# Patient Record
Sex: Male | Born: 1990 | Race: Black or African American | Hispanic: No | Marital: Single | Smoking: Current every day smoker
Health system: Southern US, Community
[De-identification: ages and names within clinical notes are randomized; demographics above are authoritative.]

## PROBLEM LIST (undated history)

## (undated) ENCOUNTER — Emergency Department: Discharge status: Absent without leave | Payer: No Typology Code available for payment source

## (undated) DIAGNOSIS — R569 Unspecified convulsions: Secondary | ICD-10-CM

---
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
## 2014-06-14 ENCOUNTER — Emergency Department
Admit: 2014-06-14 | Disposition: A | Discharge status: Home / Self Care | Payer: Self-pay | Admitting: Emergency Medicine

## 2015-11-15 ENCOUNTER — Encounter: Payer: Self-pay | Admitting: Emergency Medicine

## 2015-11-15 ENCOUNTER — Emergency Department
Admission: EM | Admit: 2015-11-15 | Discharge: 2015-11-15 | Disposition: A | Discharge status: Home / Self Care | Payer: Self-pay | Attending: Emergency Medicine | Admitting: Emergency Medicine

## 2015-11-15 DIAGNOSIS — L255 Unspecified contact dermatitis due to plants, except food: Secondary | ICD-10-CM | POA: Insufficient documentation

## 2015-11-15 DIAGNOSIS — Z79899 Other long term (current) drug therapy: Secondary | ICD-10-CM | POA: Insufficient documentation

## 2015-11-15 DIAGNOSIS — L259 Unspecified contact dermatitis, unspecified cause: Secondary | ICD-10-CM

## 2015-11-15 HISTORY — DX: Unspecified convulsions: R56.9

## 2015-11-15 MED ORDER — DEXAMETHASONE SODIUM PHOSPHATE 10 MG/ML IJ SOLN
10.0000 mg | Freq: Once | INTRAMUSCULAR | Status: AC
  Administered 2015-11-15: 10 mg via INTRAMUSCULAR
  Filled 2015-11-15: qty 1

## 2015-11-15 MED ORDER — PREDNISONE 10 MG PO TABS: ORAL_TABLET | ORAL | 0 refills | Status: AC

## 2015-11-15 MED ORDER — DEXAMETHASONE SODIUM PHOSPHATE 10 MG/ML IJ SOLN
INTRAMUSCULAR | Status: AC
Start: 2015-11-15 — End: 2015-11-15
  Administered 2015-11-15: 10 mg via INTRAMUSCULAR
  Filled 2015-11-15: qty 1

## 2015-11-15 NOTE — ED Triage Notes (Signed)
Pt states "I got into some poison ivy", reports using OTC creams with no relief. Pt reports rash to body x1 week, airway intact, no respiratory distress noted.

## 2015-11-15 NOTE — ED Provider Notes (Signed)
[facility] Emergency Department Provider Note  ____________________________________________   First MD Initiated Contact with Patient 11/15/15 1322     (approximate)  I have reviewed the triage vital signs and the nursing notes.   HISTORY  Chief Complaint Rash   HPI Sergio Lindsey is a 25 y.o. male here complaining of rash for approximate 4-5 days. 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 Patient states he has been itching has been using over-the-counter medication without any relief. He has been also applying topical cream without any relief of his rash or itching. He denies any previous problems with dermatitis.   Past Medical History:  Diagnosis Date  . Seizures (HCC)     There are no active problems to display for this patient.   No past surgical history on file.  Prior to Admission medications   Medication Sig Start Date End Date Taking? Authorizing Provider  divalproex (DEPAKOTE ER) 500 MG 24 hr tablet Take 750 mg by mouth daily.   Yes Historical Provider, MD  predniSONE (DELTASONE) 10 MG tablet Take 6 tablets  today, on day 2 take 5 tablets, day 3 take 4 tablets, day 4 take 3 tablets, day 5 take  2 tablets and 1 tablet the last day 11/15/15   Tommi Rumps, PA-C    Allergies Review of patient's allergies indicates no known allergies.  No family history on file.  Social History Social History  Substance Use Topics  . Smoking status: Never Smoker  . Smokeless tobacco: Never Used  . Alcohol use No    Review of Systems Constitutional: No fever/chills Eyes: No visual changes. Cardiovascular: Denies chest pain. Respiratory: Denies shortness of breath. Gastrointestinal: No nausea, no vomiting.  Musculoskeletal: Negative for back pain. Skin: Positive for rash. Neurological: Negative for headaches, focal weakness or numbness.  10-point ROS otherwise  negative.  ____________________________________________   PHYSICAL EXAM:  VITAL SIGNS: ED Triage Vitals  Enc Vitals Group     BP 11/15/15 1325 125/67     Pulse Rate 11/15/15 1325 89     Resp 11/15/15 1325 16     Temp 11/15/15 1325 98.4 F (36.9 C)     Temp Source 11/15/15 1325 Oral     SpO2 11/15/15 1325 99 %     Weight 11/15/15 1326 148 lb (67.1 kg)     Height 11/15/15 1326 5\' 7"  (1.702 m)     Head Circumference --      Peak Flow --      Pain Score --      Pain Loc --      Pain Edu? --      Excl. in GC? --     Constitutional: Alert and oriented. Well appearing and in no acute distress. Eyes: Conjunctivae are normal. PERRL. EOMI. Head: Atraumatic. Nose: No congestion/rhinnorhea. Mouth/Throat: Mucous membranes are moist.  Oropharynx non-erythematous.  No edema noted. Neck: No stridor.   Cardiovascular: Normal rate, regular rhythm. Grossly normal heart sounds.  Good peripheral circulation. Respiratory: Normal respiratory effort.  No retractions. Lungs CTAB. Musculoskeletal: No lower extremity tenderness nor edema.  No joint effusions. Neurologic:  Normal speech and language. No gross focal neurologic deficits are appreciated.  Skin:  Skin is warm, dry and intact.  There is a papular rash noted on the face, arms, hands and lower extremities. There are no vesicles noted. Patient is noted currently to be scratching at his legs. Psychiatric: Mood and  affect are normal. Speech and behavior are normal.  ____________________________________________   LABS (all labs ordered are listed, but only abnormal results are displayed)  Labs Reviewed - No data to display  PROCEDURES  Procedure(s) performed: None  Procedures  Critical Care performed: No  ____________________________________________   INITIAL IMPRESSION / ASSESSMENT AND PLAN / ED COURSE  Pertinent labs & imaging results that were available during my care of the patient were reviewed by me and considered in my  medical decision making (see chart for details).    Clinical Course   Patient is continue taking Benadryl over-the-counter as needed for itching. He was given Decadron 10 mg IM while in the emergency room and a prescription for prednisone 60 mg, 6 day taper. Patient is instructed to follow-up with [facility] or his primary care doctor if any continued problems.  ____________________________________________   FINAL CLINICAL IMPRESSION(S) / ED DIAGNOSES  Final diagnoses:  Contact dermatitis      NEW MEDICATIONS STARTED DURING THIS VISIT:  Discharge Medication List as of 11/15/2015  1:49 PM    START taking these medications   Details  predniSONE (DELTASONE) 10 MG tablet Take 6 tablets  today, on day 2 take 5 tablets, day 3 take 4 tablets, day 4 take 3 tablets, day 5 take  2 tablets and 1 tablet the last day, Print         Note:  This document was prepared using Dragon voice recognition software and may include unintentional dictation errors.    Tommi RumpsRhonda L Misti Towle, PA-C 11/15/15 1727    Myrna Blazeravid Matthew Schaevitz, MD 11/16/15 [phone]

## 2015-11-15 NOTE — Discharge Instructions (Signed)
Begin taking prednisone as directed. Follow-up with your primary care doctor in [place] or see [facility] acute-care if any continued problems.

## 2016-04-13 ENCOUNTER — Emergency Department
Admission: EM | Admit: 2016-04-13 | Discharge: 2016-04-13 | Disposition: A | Discharge status: Home / Self Care | Payer: Self-pay | Attending: Emergency Medicine | Admitting: Emergency Medicine

## 2016-04-13 DIAGNOSIS — Z202 Contact with and (suspected) exposure to infections with a predominantly sexual mode of transmission: Secondary | ICD-10-CM | POA: Insufficient documentation

## 2016-04-13 DIAGNOSIS — N341 Nonspecific urethritis: Secondary | ICD-10-CM | POA: Insufficient documentation

## 2016-04-13 DIAGNOSIS — Z711 Person with feared health complaint in whom no diagnosis is made: Secondary | ICD-10-CM

## 2016-04-13 LAB — URINALYSIS, COMPLETE (UACMP) WITH MICROSCOPIC
BACTERIA UA: NONE SEEN
BILIRUBIN URINE: NEGATIVE
Glucose, UA: NEGATIVE mg/dL
HGB URINE DIPSTICK: NEGATIVE
KETONES UR: 5 mg/dL — AB
NITRITE: NEGATIVE
PH: 7 (ref 5.0–8.0)
Protein, ur: 30 mg/dL — AB
SPECIFIC GRAVITY, URINE: 1.025 (ref 1.005–1.030)
Squamous Epithelial / LPF: NONE SEEN

## 2016-04-13 LAB — CHLAMYDIA/NGC RT PCR (ARMC ONLY)
CHLAMYDIA TR: NOT DETECTED
N gonorrhoeae: DETECTED — AB

## 2016-04-13 MED ORDER — CEFTRIAXONE SODIUM 1 G IJ SOLR
500.0000 mg | Freq: Once | INTRAMUSCULAR | Status: AC
  Administered 2016-04-13: 500 mg via INTRAMUSCULAR
  Filled 2016-04-13: qty 10

## 2016-04-13 MED ORDER — AZITHROMYCIN 500 MG PO TABS
1000.0000 mg | ORAL_TABLET | Freq: Once | ORAL | Status: AC
  Administered 2016-04-13: 1000 mg via ORAL
  Filled 2016-04-13: qty 2

## 2016-04-13 NOTE — ED Notes (Signed)
Pt presents to ED with c/o burning with urination and yellow discharge. Pt states that he masturbated last week with bath and body works lotion "but didn't know it had the chemicals in it". Pt states that he thinks it's the bath and body works lotion because he "hasn't been around nothing like that" without using a condom.

## 2016-04-13 NOTE — ED Triage Notes (Signed)
Pt c/o penile irritation, painful urination for the past week and wants it checked "so I can have sex"

## 2016-04-13 NOTE — ED Notes (Signed)
NAD noted at time of D/C. Pt denies questions or concerns. Pt ambulatory to the lobby at this time.  

## 2016-04-13 NOTE — Discharge Instructions (Signed)
Follow-up with [facility]. Call phone number listed on the paperwork given to you. [phone]. Your Chlamydia and gonorrhea testing will not be resulted until much later this evening. However today he had been treated for both sexually transmitted diseases. Sexual partners will need to be treated at the health department. Information about safe sex is also provided for review.

## 2016-04-13 NOTE — ED Notes (Signed)
While doing VS pt stated "I was masterbating while using bath and body works lotion for 20 min to make them smell good."

## 2016-04-13 NOTE — ED Provider Notes (Signed)
[facility] Emergency Department Provider Note  ____________________________________________   First MD Initiated Contact with Patient 04/13/16 1455     (approximate)  I have reviewed the triage vital signs and the nursing notes.   HISTORY  Chief Complaint Penile Discharge   HPI Sergio Lindsey is a 26 y.o. male is here complaining of penile discharge and started yesterday. Patient gives a history of masturbating while using some  Bath and body lotion. Patient states that he did not know that this was considered a chemical and believes that this is causing this problem. He is here to be "checked out" because he wants to have sex. He denies any previous urethral discharges in the past. He denies any fever, chills, nausea or vomiting. He states the discharge is yellow but cannot describe it any further. He has been using napkins inside his underwear.   Past Medical History:  Diagnosis Date  . Seizures (HCC)     There are no active problems to display for this patient.   History reviewed. No pertinent surgical history.  Prior to Admission medications   Medication Sig Start Date End Date Taking? Authorizing Provider  divalproex (DEPAKOTE ER) 500 MG 24 hr tablet Take 750 mg by mouth daily.    Historical Provider, MD  predniSONE (DELTASONE) 10 MG tablet Take 6 tablets  today, on day 2 take 5 tablets, day 3 take 4 tablets, day 4 take 3 tablets, day 5 take  2 tablets and 1 tablet the last day 11/15/15   Tommi Rumpshonda L Talullah Abate, PA-C    Allergies Patient has no known allergies.  No family history on file.  Social History Social History  Substance Use Topics  . Smoking status: Never Smoker  . Smokeless tobacco: Never Used  . Alcohol use No    Review of Systems Constitutional: No fever/chills Cardiovascular: Denies chest pain. Respiratory: Denies shortness of breath. Gastrointestinal:  No nausea, no vomiting.  Genitourinary: Positive for  dysuria. Musculoskeletal: Negative for back pain. Skin: Negative for rash. Neurological: Negative for headaches, focal weakness or numbness.  10-point ROS otherwise negative.  ____________________________________________   PHYSICAL EXAM:  VITAL SIGNS: ED Triage Vitals  Enc Vitals Group     BP 04/13/16 1426 136/83     Pulse Rate 04/13/16 1426 70     Resp 04/13/16 1426 20     Temp 04/13/16 1426 98 F (36.7 C)     Temp Source 04/13/16 1426 Oral     SpO2 04/13/16 1426 99 %     Weight 04/13/16 1427 165 lb (74.8 kg)     Height 04/13/16 1427 5\' 7"  (1.702 m)     Head Circumference --      Peak Flow --      Pain Score --      Pain Loc --      Pain Edu? --      Excl. in GC? --     Constitutional: Alert and oriented. Well appearing and in no acute distress. Eyes: Conjunctivae are normal. PERRL. EOMI. Head: Atraumatic. Nose: No congestion/rhinnorhea. Neck: No stridor.   Cardiovascular: Normal rate, regular rhythm. Grossly normal heart sounds.  Good peripheral circulation. Respiratory: Normal respiratory effort.  No retractions. Lungs CTAB. Gastrointestinal: Soft and nontender. No distention.  Genitourinary: Copious purulent discharge noted from the meatus. No vesicles were noted. Musculoskeletal: No lower extremity tenderness nor edema.  No joint effusions. Neurologic:  Normal speech and language. No gross focal neurologic deficits are appreciated. No gait instability.  Skin:  Skin is warm, dry and intact. No rash noted. Psychiatric: Mood and affect are normal. Speech and behavior are normal.  ____________________________________________   LABS (all labs ordered are listed, but only abnormal results are displayed)  Labs Reviewed  CHLAMYDIA/NGC RT PCR ([facility] ONLY) - Abnormal; Notable for the following:       Result Value   N gonorrhoeae DETECTED (*)    All other components within normal limits  URINALYSIS, COMPLETE (UACMP) WITH MICROSCOPIC - Abnormal; Notable for  the following:    Color, Urine YELLOW (*)    APPearance CLEAR (*)    Ketones, ur 5 (*)    Protein, ur 30 (*)    Leukocytes, UA SMALL (*)    All other components within normal limits    PROCEDURES  Procedure(s) performed: None  Procedures  Critical Care performed: No  ____________________________________________   INITIAL IMPRESSION / ASSESSMENT AND PLAN / ED COURSE  Pertinent labs & imaging results that were available during my care of the patient were reviewed by me and considered in my medical decision making (see chart for details).  Patient finally admitted that he had oral sex with someone different than his girlfriend. He was encouraged to have physical was treated and given information about the STD clinic at the health Department. Patient states that he was unaware that he could get any diseases from oral sex. Patient was given Rocephin 500 mg IM and Zithromax thousand milligrams by mouth while in the emergency room.      ____________________________________________   FINAL CLINICAL IMPRESSION(S) / ED DIAGNOSES  Final diagnoses:  Nonspecific urethritis  Concern about STD in male without diagnosis      NEW MEDICATIONS STARTED DURING THIS VISIT:  Discharge Medication List as of 04/13/2016  3:58 PM       Note:  This document was prepared using Dragon voice recognition software and may include unintentional dictation errors.    Tommi Rumps, PA-C 04/15/16 1610    Jennye Moccasin, MD 04/15/16 [phone]

## 2016-04-14 ENCOUNTER — Telehealth: Payer: Self-pay | Admitting: Emergency Medicine

## 2016-04-14 NOTE — Telephone Encounter (Signed)
Contacted patient and informed him of positive gonorrhea test.  He says partner is going in for treatment today.

## 2017-08-13 ENCOUNTER — Emergency Department
Admission: EM | Admit: 2017-08-13 | Discharge: 2017-08-13 | Disposition: A | Discharge status: Home / Self Care | Payer: Self-pay | Attending: Emergency Medicine | Admitting: Emergency Medicine

## 2017-08-13 ENCOUNTER — Encounter: Payer: Self-pay | Admitting: Medical Oncology

## 2017-08-13 DIAGNOSIS — R569 Unspecified convulsions: Secondary | ICD-10-CM | POA: Insufficient documentation

## 2017-08-13 LAB — COMPREHENSIVE METABOLIC PANEL
ALT: 13 U/L — AB (ref 17–63)
AST: 37 U/L (ref 15–41)
Albumin: 4.3 g/dL (ref 3.5–5.0)
Alkaline Phosphatase: 62 U/L (ref 38–126)
Anion gap: 17 — ABNORMAL HIGH (ref 5–15)
BILIRUBIN TOTAL: 0.3 mg/dL (ref 0.3–1.2)
BUN: 14 mg/dL (ref 6–20)
CO2: 19 mmol/L — ABNORMAL LOW (ref 22–32)
CREATININE: 0.89 mg/dL (ref 0.61–1.24)
Calcium: 9.6 mg/dL (ref 8.9–10.3)
Chloride: 104 mmol/L (ref 101–111)
GFR calc Af Amer: >60 mL/min (ref >60)
Glucose, Bld: 182 mg/dL — ABNORMAL HIGH (ref 65–99)
POTASSIUM: 3.7 mmol/L (ref 3.5–5.1)
Sodium: 140 mmol/L (ref 135–145)
TOTAL PROTEIN: 7.9 g/dL (ref 6.5–8.1)

## 2017-08-13 LAB — CBC WITH DIFFERENTIAL/PLATELET
BASOS ABS: 0.1 10*3/uL (ref 0–0.1)
Basophils Relative: 1 %
Eosinophils Absolute: 0.1 10*3/uL (ref 0–0.7)
Eosinophils Relative: 1 %
HEMATOCRIT: 43.7 % (ref 40.0–52.0)
Hemoglobin: 14 g/dL (ref 13.0–18.0)
LYMPHS ABS: 5.5 10*3/uL — AB (ref 1.0–3.6)
LYMPHS PCT: 37 %
MCH: 27.8 pg (ref 26.0–34.0)
MCHC: 32.1 g/dL (ref 32.0–36.0)
MCV: 86.7 fL (ref 80.0–100.0)
Monocytes Absolute: 1.3 10*3/uL — ABNORMAL HIGH (ref 0.2–1.0)
Monocytes Relative: 9 %
NEUTROS ABS: 7.9 10*3/uL — AB (ref 1.4–6.5)
Neutrophils Relative %: 52 %
Platelets: 209 10*3/uL (ref 150–440)
RBC: 5.05 MIL/uL (ref 4.40–5.90)
RDW: 12.7 % (ref 11.5–14.5)
WBC: 14.9 10*3/uL — ABNORMAL HIGH (ref 3.8–10.6)

## 2017-08-13 LAB — GLUCOSE, CAPILLARY: Glucose-Capillary: 172 mg/dL — ABNORMAL HIGH (ref 65–99)

## 2017-08-13 LAB — VALPROIC ACID LEVEL: VALPROIC ACID LVL: 30 ug/mL — AB (ref 50.0–100.0)

## 2017-08-13 MED ORDER — SODIUM CHLORIDE 0.9 % IV SOLN
Freq: Once | INTRAVENOUS | Status: AC
  Administered 2017-08-13: 14:00:00 via INTRAVENOUS

## 2017-08-13 MED ORDER — DIVALPROEX SODIUM 250 MG PO DR TAB
250.0000 mg | DELAYED_RELEASE_TABLET | Freq: Once | ORAL | Status: AC
  Administered 2017-08-13: 250 mg via ORAL
  Filled 2017-08-13: qty 1

## 2017-08-13 MED ORDER — ONDANSETRON HCL 4 MG/2ML IJ SOLN
4.0000 mg | Freq: Once | INTRAMUSCULAR | Status: AC | PRN
  Administered 2017-08-13: 4 mg via INTRAVENOUS

## 2017-08-13 MED ORDER — METOCLOPRAMIDE HCL 5 MG/ML IJ SOLN
10.0000 mg | Freq: Once | INTRAMUSCULAR | Status: AC
  Administered 2017-08-13: 10 mg via INTRAVENOUS
  Filled 2017-08-13: qty 2

## 2017-08-13 MED ORDER — ONDANSETRON HCL 4 MG/2ML IJ SOLN
INTRAMUSCULAR | Status: AC
  Administered 2017-08-13: 4 mg via INTRAVENOUS
  Filled 2017-08-13: qty 2

## 2017-08-13 NOTE — ED Notes (Signed)
Pt discharged by C. Webb SilversmithWelch, RN.

## 2017-08-13 NOTE — ED Provider Notes (Signed)
[facility] Emergency Department Provider Note       Time seen: ----------------------------------------- 1:52 PM on 08/13/2017 -----------------------------------------   I have reviewed the triage vital signs and the nursing notes.  HISTORY   Chief Complaint Seizures    HPI Sergio Lindsey is a 27 y.o. male with a history of seizures who presents to the ED for a seizure just prior to arrival.  Patient arrives via EMS with reports he was riding in the car with his friends and they witnessed him having a seizure-like episode.  Patient states he is missed the past several days of his Depakote and has had significant alcohol.  He is currently complaining of headache which is typical for him post seizure.  He was postictal by EMS.  He denies recent illness or other complaints.  Past Medical History:  Diagnosis Date  . Seizures (HCC)     There are no active problems to display for this patient.   History reviewed. No pertinent surgical history.  Allergies Patient has no known allergies.  Social History Social History   Tobacco Use  . Smoking status: Never Smoker  . Smokeless tobacco: Never Used  Substance Use Topics  . Alcohol use: No  . Drug use: Yes    Types: Marijuana   Review of Systems Constitutional: Negative for fever. Cardiovascular: Negative for chest pain. Respiratory: Negative for shortness of breath. Gastrointestinal: Negative for abdominal pain, vomiting and diarrhea. Musculoskeletal: Negative for back pain. Skin: Negative for rash. Neurological: Positive for headache, recent seizure  All systems negative/normal/unremarkable except as stated in the HPI  ____________________________________________   PHYSICAL EXAM:  VITAL SIGNS: ED Triage Vitals  Enc Vitals Group     BP 08/13/17 1329 117/67     Pulse Rate 08/13/17 1329 91     Resp 08/13/17 1329 16     Temp 08/13/17 1329 98.1 F (36.7 C)     Temp Source 08/13/17 1329  Oral     SpO2 08/13/17 1329 97 %     Weight 08/13/17 1331 170 lb (77.1 kg)     Height 08/13/17 1331 5\' 7"  (1.702 m)     Head Circumference --      Peak Flow --      Pain Score 08/13/17 1331 8     Pain Loc --      Pain Edu? --      Excl. in GC? --    Constitutional: Alert and oriented. Well appearing and in no distress. Eyes: Conjunctivae are normal. Normal extraocular movements. ENT   Head: Normocephalic and atraumatic.   Nose: No congestion/rhinnorhea.   Mouth/Throat: Mucous membranes are moist.   Neck: No stridor. Cardiovascular: Normal rate, regular rhythm. No murmurs, rubs, or gallops. Respiratory: Normal respiratory effort without tachypnea nor retractions. Breath sounds are clear and equal bilaterally. No wheezes/rales/rhonchi. Gastrointestinal: Soft and nontender. Normal bowel sounds Musculoskeletal: Nontender with normal range of motion in extremities. No lower extremity tenderness nor edema. Neurologic:  Normal speech and language. No gross focal neurologic deficits are appreciated.  Skin:  Skin is warm, dry and intact. No rash noted. Psychiatric: Mood and affect are normal. Speech and behavior are normal.  ____________________________________________  ED COURSE:  As part of my medical decision making, I reviewed the following data within the electronic MEDICAL RECORD NUMBER History obtained from family if available, nursing notes, old chart and ekg, as well as notes from prior ED visits. Patient presented for seizures, we will assess with labs and imaging  as indicated at this time.   Procedures ____________________________________________   LABS (pertinent positives/negatives)  Labs Reviewed  GLUCOSE, CAPILLARY - Abnormal; Notable for the following components:      Result Value   Glucose-Capillary 172 (*)    All other components within normal limits  VALPROIC ACID LEVEL - Abnormal; Notable for the following components:   Valproic Acid Lvl 30 (*)    All  other components within normal limits  CBC WITH DIFFERENTIAL/PLATELET - Abnormal; Notable for the following components:   WBC 14.9 (*)    Neutro Abs 7.9 (*)    Lymphs Abs 5.5 (*)    Monocytes Absolute 1.3 (*)    All other components within normal limits  COMPREHENSIVE METABOLIC PANEL - Abnormal; Notable for the following components:   CO2 19 (*)    Glucose, Bld 182 (*)    ALT 13 (*)    Anion gap 17 (*)    All other components within normal limits  CBG MONITORING, ED  ____________________________________________  DIFFERENTIAL DIAGNOSIS   Seizure, medication noncompliance  FINAL ASSESSMENT AND PLAN  Seizure   Plan: The patient had presented for seizure secondary to medication noncompliance. Patient's labs did reveal a low Depakote level consistent with his noncompliance.  Patient is also admitted to drinking alcohol recently.  We gave him an extra dose of Depakote here.  He is cleared for outpatient follow-up.   Ulice Dash, MD   Note: This note was generated in part or whole with voice recognition software. Voice recognition is usually quite accurate but there are transcription errors that can and very often do occur. I apologize for any typographical errors that were not detected and corrected.     Emily Filbert, MD 08/13/17 [phone]

## 2017-08-13 NOTE — ED Notes (Signed)
Pt attempting to contact someone for ride. Mother not picking up at this time.

## 2017-08-13 NOTE — ED Triage Notes (Signed)
Pt to ED via ems with reports that pt was riding in the car with his friends when they witnessed him have a seizure. Pt has hx of seizures, states that he has missed a few doses of his seizure meds. Pt c/o headache currently. Was postictal with EMS.

## 2017-10-31 ENCOUNTER — Emergency Department
Admission: EM | Admit: 2017-10-31 | Discharge: 2017-10-31 | Disposition: A | Discharge status: Home / Self Care | Payer: Self-pay | Attending: Emergency Medicine | Admitting: Emergency Medicine

## 2017-10-31 ENCOUNTER — Other Ambulatory Visit: Payer: Self-pay

## 2017-10-31 ENCOUNTER — Encounter: Payer: Self-pay | Admitting: Emergency Medicine

## 2017-10-31 DIAGNOSIS — R42 Dizziness and giddiness: Secondary | ICD-10-CM

## 2017-10-31 DIAGNOSIS — Z79899 Other long term (current) drug therapy: Secondary | ICD-10-CM | POA: Insufficient documentation

## 2017-10-31 DIAGNOSIS — E860 Dehydration: Secondary | ICD-10-CM | POA: Insufficient documentation

## 2017-10-31 LAB — URINALYSIS, COMPLETE (UACMP) WITH MICROSCOPIC
BACTERIA UA: NONE SEEN
Bilirubin Urine: NEGATIVE
Glucose, UA: NEGATIVE mg/dL
HGB URINE DIPSTICK: NEGATIVE
Ketones, ur: 5 mg/dL — AB
Leukocytes, UA: NEGATIVE
Nitrite: NEGATIVE
Protein, ur: 30 mg/dL — AB
SPECIFIC GRAVITY, URINE: 1.028 (ref 1.005–1.030)
Squamous Epithelial / LPF: NONE SEEN (ref 0–5)
pH: 6 (ref 5.0–8.0)

## 2017-10-31 LAB — BASIC METABOLIC PANEL
ANION GAP: 5 (ref 5–15)
BUN: 17 mg/dL (ref 6–20)
CALCIUM: 9.1 mg/dL (ref 8.9–10.3)
CO2: 26 mmol/L (ref 22–32)
CREATININE: 0.76 mg/dL (ref 0.61–1.24)
Chloride: 107 mmol/L (ref 98–111)
GFR calc Af Amer: >60 mL/min (ref >60)
GFR calc non Af Amer: >60 mL/min (ref >60)
GLUCOSE: 104 mg/dL — AB (ref 70–99)
Potassium: 4 mmol/L (ref 3.5–5.1)
Sodium: 138 mmol/L (ref 135–145)

## 2017-10-31 LAB — CBC
HCT: 39.8 % — ABNORMAL LOW (ref 40.0–52.0)
HEMOGLOBIN: 13.4 g/dL (ref 13.0–18.0)
MCH: 28.4 pg (ref 26.0–34.0)
MCHC: 33.7 g/dL (ref 32.0–36.0)
MCV: 84.1 fL (ref 80.0–100.0)
Platelets: 163 10*3/uL (ref 150–440)
RBC: 4.73 MIL/uL (ref 4.40–5.90)
RDW: 14.5 % (ref 11.5–14.5)
WBC: 9.1 10*3/uL (ref 3.8–10.6)

## 2017-10-31 LAB — TROPONIN I

## 2017-10-31 NOTE — ED Provider Notes (Signed)
[facility] Emergency Department Provider Note    First MD Initiated Contact with Patient 10/31/17 [phone]     (approximate)  I have reviewed the triage vital signs and the nursing notes.   HISTORY  Chief Complaint Dizziness    HPI Sergio Lindsey is a 27 y.o. male history of seizure disorder for which patient takes Depakote presents to emergency department with brief episode of positional dizziness lasting approximately 30 minuteswhich occurred yesterday morning and has since resolved. Patient states dizziness occurred with standing. 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 Patient denies any headache no visual changes no weakness numbness gait instability. Patient denies any loss of consciousness.   Past Medical History:  Diagnosis Date  . Seizures (HCC)     There are no active problems to display for this patient.   History reviewed. No pertinent surgical history.  Prior to Admission medications   Medication Sig Start Date End Date Taking? Authorizing Provider  divalproex (DEPAKOTE ER) 500 MG 24 hr tablet Take 750 mg by mouth daily.    [provider]  predniSONE (DELTASONE) 10 MG tablet Take 6 tablets  today, on day 2 take 5 tablets, day 3 take 4 tablets, day 4 take 3 tablets, day 5 take  2 tablets and 1 tablet the last day 11/15/15   Tommi Rumps, PA-C    Allergies no known drug allergies No family history on file.  Social History Social History   Tobacco Use  . Smoking status: Never Smoker  . Smokeless tobacco: Never Used  Substance Use Topics  . Alcohol use: No  . Drug use: Yes    Types: Marijuana    Review of Systems Constitutional: No fever/chills Eyes: No visual changes. ENT: No sore throat. Cardiovascular: Denies chest pain. Respiratory: Denies shortness of breath. Gastrointestinal: No abdominal pain.  No nausea, no vomiting.  No diarrhea.  No  constipation. Genitourinary: Negative for dysuria. Musculoskeletal: Negative for neck pain.  Negative for back pain. Integumentary: Negative for rash. Neurological: Negative for headaches, focal weakness or numbness.   ____________________________________________   PHYSICAL EXAM:  VITAL SIGNS: ED Triage Vitals  Enc Vitals Group     BP 10/31/17 0055 127/75     Pulse Rate 10/31/17 0055 89     Resp 10/31/17 0055 18     Temp 10/31/17 0055 98.6 F (37 C)     Temp Source 10/31/17 0055 Oral     SpO2 10/31/17 0055 98 %     Weight 10/31/17 0058 68 kg (150 lb)     Height 10/31/17 0058 1.702 m (5\' 7" )     Head Circumference --      Peak Flow --      Pain Score 10/31/17 0058 0     Pain Loc --      Pain Edu? --      Excl. in GC? --     Constitutional: Alert and oriented. Well appearing and in no acute distress. Eyes: Conjunctivae are normal. PERRL. EOMI. Head: Atraumatic. Mouth/Throat: Mucous membranes are moist. Oropharynx non-erythematous. Neck: No stridor.  Cardiovascular: Normal rate, regular rhythm. Good peripheral circulation. Grossly normal heart sounds. Respiratory: Normal respiratory effort.  No retractions. Lungs CTAB. Gastrointestinal: Soft and nontender. No distention.  Musculoskeletal: No lower extremity tenderness nor edema. No gross deformities of extremities. Neurologic:  Normal speech and language. No gross focal neurologic deficits are appreciated.  Skin:  Skin is warm, dry  and intact. No rash noted. Psychiatric: Mood and affect are normal. Speech and behavior are normal.  ____________________________________________   LABS (all labs ordered are listed, but only abnormal results are displayed)  Labs Reviewed  BASIC METABOLIC PANEL - Abnormal; Notable for the following components:      Result Value   Glucose, Bld 104 (*)    All other components within normal limits  CBC - Abnormal; Notable for the following components:   HCT 39.8 (*)    All other  components within normal limits  URINALYSIS, COMPLETE (UACMP) WITH MICROSCOPIC - Abnormal; Notable for the following components:   Color, Urine YELLOW (*)    APPearance CLEAR (*)    Ketones, ur 5 (*)    Protein, ur 30 (*)    All other components within normal limits  TROPONIN I   ED ECG REPORT I, [place] N Sarha Bartelt, the attending physician, personally viewed and interpreted this ECG.   Date: 10/30/2017  EKG Time: 1:00 AM  Rate: 91  Rhythm: normal sinus rhythm  Axis: normal  Intervals:normal  ST&T Change: none  ___________________________________   Procedures   ____________________________________________   INITIAL IMPRESSION / ASSESSMENT AND PLAN / ED COURSE  As part of my medical decision making, I reviewed the following data within the electronic MEDICAL RECORD NUMBER   27 year old male presenting with above stated history of physical exam of brief episode of dizziness lasting approximately few minutes with spontaneous resolution and no concomitant symptoms.patient's clinical exam unremarkable. Laboratory dataconsistent with possible dehydration. EKG revealed no acute findings.    ____________________________________________  FINAL CLINICAL IMPRESSION(S) / ED DIAGNOSES  Final diagnoses:  Dehydration  Dizziness     MEDICATIONS GIVEN DURING THIS VISIT:  Medications - No data to display   ED Discharge Orders    None       Note:  This document was prepared using Dragon voice recognition software and may include unintentional dictation errors.    Darci Current, MD 10/31/17 [phone]

## 2017-10-31 NOTE — ED Notes (Signed)
Dizziness earlier but feeling much better

## 2017-10-31 NOTE — ED Triage Notes (Signed)
Patient ambulatory to triage with steady gait, without difficulty or distress noted; pt reports dizziness since this morning when getting ready for work; denies any accomp symptoms

## 2018-05-31 ENCOUNTER — Other Ambulatory Visit: Payer: Self-pay

## 2018-05-31 ENCOUNTER — Emergency Department: Payer: Self-pay

## 2018-05-31 ENCOUNTER — Encounter: Payer: Self-pay | Admitting: Emergency Medicine

## 2018-05-31 ENCOUNTER — Emergency Department
Admission: EM | Admit: 2018-05-31 | Discharge: 2018-05-31 | Disposition: A | Discharge status: Home / Self Care | Payer: Self-pay | Attending: Student in an Organized Health Care Education/Training Program | Admitting: Student in an Organized Health Care Education/Training Program

## 2018-05-31 DIAGNOSIS — Z79899 Other long term (current) drug therapy: Secondary | ICD-10-CM | POA: Insufficient documentation

## 2018-05-31 DIAGNOSIS — Y998 Other external cause status: Secondary | ICD-10-CM | POA: Insufficient documentation

## 2018-05-31 DIAGNOSIS — Y92410 Unspecified street and highway as the place of occurrence of the external cause: Secondary | ICD-10-CM | POA: Insufficient documentation

## 2018-05-31 DIAGNOSIS — R0789 Other chest pain: Secondary | ICD-10-CM | POA: Insufficient documentation

## 2018-05-31 DIAGNOSIS — Y9389 Activity, other specified: Secondary | ICD-10-CM | POA: Insufficient documentation

## 2018-05-31 NOTE — ED Notes (Signed)
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
## 2020-05-07 ENCOUNTER — Emergency Department
Admission: EM | Admit: 2020-05-07 | Discharge: 2020-05-08 | Disposition: A | Discharge status: Home / Self Care | Payer: No Typology Code available for payment source | Attending: Emergency Medicine | Admitting: Emergency Medicine

## 2020-05-07 ENCOUNTER — Emergency Department: Payer: No Typology Code available for payment source

## 2020-05-07 ENCOUNTER — Other Ambulatory Visit: Payer: Self-pay

## 2020-05-07 DIAGNOSIS — Y9289 Other specified places as the place of occurrence of the external cause: Secondary | ICD-10-CM | POA: Diagnosis not present

## 2020-05-07 DIAGNOSIS — R109 Unspecified abdominal pain: Secondary | ICD-10-CM | POA: Insufficient documentation

## 2020-05-07 DIAGNOSIS — S00512A Abrasion of oral cavity, initial encounter: Secondary | ICD-10-CM | POA: Diagnosis not present

## 2020-05-07 DIAGNOSIS — R739 Hyperglycemia, unspecified: Secondary | ICD-10-CM | POA: Insufficient documentation

## 2020-05-07 DIAGNOSIS — F17200 Nicotine dependence, unspecified, uncomplicated: Secondary | ICD-10-CM | POA: Insufficient documentation

## 2020-05-07 DIAGNOSIS — R569 Unspecified convulsions: Secondary | ICD-10-CM | POA: Diagnosis not present

## 2020-05-07 DIAGNOSIS — S0993XA Unspecified injury of face, initial encounter: Secondary | ICD-10-CM | POA: Diagnosis present

## 2020-05-07 LAB — CBC WITH DIFFERENTIAL/PLATELET
Abs Immature Granulocytes: 0.21 10*3/uL — ABNORMAL HIGH (ref 0.00–0.07)
Basophils Absolute: 0.1 10*3/uL (ref 0.0–0.1)
Basophils Relative: 1 %
Eosinophils Absolute: 0.1 10*3/uL (ref 0.0–0.5)
Eosinophils Relative: 1 %
HCT: 47.3 % (ref 39.0–52.0)
Hemoglobin: 14.5 g/dL (ref 13.0–17.0)
Immature Granulocytes: 1 %
Lymphocytes Relative: 27 %
Lymphs Abs: 5.9 10*3/uL — ABNORMAL HIGH (ref 0.7–4.0)
MCH: 27.2 pg (ref 26.0–34.0)
MCHC: 30.7 g/dL (ref 30.0–36.0)
MCV: 88.6 fL (ref 80.0–100.0)
Monocytes Absolute: 1.4 10*3/uL — ABNORMAL HIGH (ref 0.1–1.0)
Monocytes Relative: 6 %
Neutro Abs: 13.9 10*3/uL — ABNORMAL HIGH (ref 1.7–7.7)
Neutrophils Relative %: 64 %
Platelets: 234 10*3/uL (ref 150–400)
RBC: 5.34 MIL/uL (ref 4.22–5.81)
RDW: 12.5 % (ref 11.5–15.5)
WBC: 21.7 10*3/uL — ABNORMAL HIGH (ref 4.0–10.5)
nRBC: 0 % (ref 0.0–0.2)

## 2020-05-07 LAB — HEPATIC FUNCTION PANEL
ALT: 12 U/L (ref 0–44)
AST: 32 U/L (ref 15–41)
Albumin: 4.5 g/dL (ref 3.5–5.0)
Alkaline Phosphatase: 61 U/L (ref 38–126)
Bilirubin, Direct: <0.1 mg/dL (ref 0.0–0.2)
Total Bilirubin: 0.7 mg/dL (ref 0.3–1.2)
Total Protein: 8 g/dL (ref 6.5–8.1)

## 2020-05-07 LAB — URINE DRUG SCREEN, QUALITATIVE (ARMC ONLY)
Amphetamines, Ur Screen: NOT DETECTED
Barbiturates, Ur Screen: NOT DETECTED
Benzodiazepine, Ur Scrn: NOT DETECTED
Cannabinoid 50 Ng, Ur ~~LOC~~: POSITIVE — AB
Cocaine Metabolite,Ur ~~LOC~~: NOT DETECTED
MDMA (Ecstasy)Ur Screen: NOT DETECTED
Methadone Scn, Ur: NOT DETECTED
Opiate, Ur Screen: NOT DETECTED
Phencyclidine (PCP) Ur S: NOT DETECTED
Tricyclic, Ur Screen: NOT DETECTED

## 2020-05-07 LAB — BLOOD GAS, VENOUS
Acid-base deficit: 3.2 mmol/L — ABNORMAL HIGH (ref 0.0–2.0)
Bicarbonate: 23.6 mmol/L (ref 20.0–28.0)
O2 Saturation: 75.3 %
Patient temperature: 37
pCO2, Ven: 48 mmHg (ref 44.0–60.0)
pH, Ven: 7.3 (ref 7.250–7.430)
pO2, Ven: 45 mmHg (ref 32.0–45.0)

## 2020-05-07 LAB — CARBAMAZEPINE LEVEL, TOTAL: Carbamazepine Lvl: <2 ug/mL — ABNORMAL LOW (ref 4.0–12.0)

## 2020-05-07 LAB — URINALYSIS, COMPLETE (UACMP) WITH MICROSCOPIC
Bilirubin Urine: NEGATIVE
Glucose, UA: >=500 mg/dL — AB
Ketones, ur: 5 mg/dL — AB
Nitrite: NEGATIVE
Protein, ur: 30 mg/dL — AB
Specific Gravity, Urine: 1.042 — ABNORMAL HIGH (ref 1.005–1.030)
pH: 5 (ref 5.0–8.0)

## 2020-05-07 LAB — BASIC METABOLIC PANEL
Anion gap: 20 — ABNORMAL HIGH (ref 5–15)
BUN: 15 mg/dL (ref 6–20)
CO2: 13 mmol/L — ABNORMAL LOW (ref 22–32)
Calcium: 9.5 mg/dL (ref 8.9–10.3)
Chloride: 104 mmol/L (ref 98–111)
Creatinine, Ser: 1.09 mg/dL (ref 0.61–1.24)
GFR, Estimated: >60 mL/min (ref >60)
Glucose, Bld: 279 mg/dL — ABNORMAL HIGH (ref 70–99)
Potassium: 3.5 mmol/L (ref 3.5–5.1)
Sodium: 137 mmol/L (ref 135–145)

## 2020-05-07 LAB — BETA-HYDROXYBUTYRIC ACID: Beta-Hydroxybutyric Acid: 0.1 mmol/L (ref 0.05–0.27)

## 2020-05-07 LAB — ETHANOL: Alcohol, Ethyl (B): <10 mg/dL (ref <10)

## 2020-05-07 LAB — VALPROIC ACID LEVEL: Valproic Acid Lvl: 71 ug/mL (ref 50.0–100.0)

## 2020-05-07 MED ORDER — FENTANYL CITRATE (PF) 100 MCG/2ML IJ SOLN
50.0000 ug | Freq: Once | INTRAMUSCULAR | Status: AC
  Administered 2020-05-07: 50 ug via INTRAVENOUS
  Filled 2020-05-07: qty 2

## 2020-05-07 MED ORDER — SODIUM CHLORIDE 0.9 % IV BOLUS
1000.0000 mL | Freq: Once | INTRAVENOUS | Status: AC
  Administered 2020-05-07: 1000 mL via INTRAVENOUS

## 2020-05-07 MED ORDER — METFORMIN HCL 500 MG PO TABS: 500.0000 mg | ORAL_TABLET | Freq: Every day | ORAL | 0 refills | Status: AC

## 2020-05-07 MED ORDER — ONDANSETRON HCL 4 MG/2ML IJ SOLN
4.0000 mg | Freq: Once | INTRAMUSCULAR | Status: AC
  Administered 2020-05-07: 4 mg via INTRAVENOUS
  Filled 2020-05-07: qty 2

## 2020-05-07 MED ORDER — IOHEXOL 300 MG/ML  SOLN
100.0000 mL | Freq: Once | INTRAMUSCULAR | Status: AC | PRN
  Administered 2020-05-07: 100 mL via INTRAVENOUS
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
## 2020-05-07 NOTE — ED Notes (Signed)
Patient transported to CT 
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
## 2020-05-08 LAB — HEMOGLOBIN A1C
Hgb A1c MFr Bld: 5.7 % — ABNORMAL HIGH (ref 4.8–5.6)
Mean Plasma Glucose: 116.89 mg/dL

## 2020-07-04 ENCOUNTER — Encounter: Payer: Self-pay | Admitting: Emergency Medicine

## 2020-07-04 ENCOUNTER — Emergency Department
Admission: EM | Admit: 2020-07-04 | Discharge: 2020-07-04 | Disposition: A | Discharge status: Home / Self Care | Payer: Self-pay | Attending: Emergency Medicine | Admitting: Emergency Medicine

## 2020-07-04 ENCOUNTER — Other Ambulatory Visit: Payer: Self-pay

## 2020-07-04 DIAGNOSIS — F17200 Nicotine dependence, unspecified, uncomplicated: Secondary | ICD-10-CM | POA: Insufficient documentation

## 2020-07-04 DIAGNOSIS — Z202 Contact with and (suspected) exposure to infections with a predominantly sexual mode of transmission: Secondary | ICD-10-CM | POA: Insufficient documentation

## 2020-07-04 LAB — URINALYSIS, COMPLETE (UACMP) WITH MICROSCOPIC
Bacteria, UA: NONE SEEN
Bilirubin Urine: NEGATIVE
Glucose, UA: NEGATIVE mg/dL
Ketones, ur: 20 mg/dL — AB
Leukocytes,Ua: NEGATIVE
Nitrite: NEGATIVE
Protein, ur: NEGATIVE mg/dL
Specific Gravity, Urine: 1.03 (ref 1.005–1.030)
pH: 5 (ref 5.0–8.0)

## 2020-07-04 LAB — RPR: RPR Ser Ql: NONREACTIVE

## 2020-07-04 LAB — CHLAMYDIA/NGC RT PCR (ARMC ONLY)
Chlamydia Tr: NOT DETECTED
N gonorrhoeae: NOT DETECTED

## 2020-07-04 LAB — HIV ANTIBODY (ROUTINE TESTING W REFLEX): HIV Screen 4th Generation wRfx: NONREACTIVE

## 2020-07-04 MED ORDER — METRONIDAZOLE 500 MG PO TABS
2000.0000 mg | ORAL_TABLET | Freq: Once | ORAL | Status: AC
  Administered 2020-07-04: 2000 mg via ORAL
  Filled 2020-07-04: qty 4

## 2020-07-04 MED ORDER — ONDANSETRON 4 MG PO TBDP
4.0000 mg | ORAL_TABLET | Freq: Once | ORAL | Status: AC
  Administered 2020-07-04: 4 mg via ORAL
  Filled 2020-07-04: qty 1

## 2020-07-04 MED ORDER — ONDANSETRON 4 MG PO TBDP: 4.0000 mg | ORAL_TABLET | Freq: Four times a day (QID) | ORAL | 0 refills | Status: AC | PRN

## 2020-07-04 MED ORDER — DOXYCYCLINE HYCLATE 100 MG PO TABS
100.0000 mg | ORAL_TABLET | Freq: Once | ORAL | Status: AC
  Administered 2020-07-04: 100 mg via ORAL
  Filled 2020-07-04: qty 1

## 2020-07-04 MED ORDER — AZITHROMYCIN 500 MG PO TABS
1000.0000 mg | ORAL_TABLET | Freq: Once | ORAL | Status: AC
  Administered 2020-07-04: 1000 mg via ORAL
  Filled 2020-07-04: qty 2

## 2020-07-04 MED ORDER — DOXYCYCLINE HYCLATE 100 MG PO CAPS
100.0000 mg | ORAL_CAPSULE | Freq: Two times a day (BID) | ORAL | 0 refills | Status: AC
Start: 2020-07-04 — End: ?

## 2020-07-04 MED ORDER — CEFTRIAXONE SODIUM 1 G IJ SOLR
500.0000 mg | Freq: Once | INTRAMUSCULAR | Status: AC
  Administered 2020-07-04: 500 mg via INTRAMUSCULAR
  Filled 2020-07-04: qty 10

## 2020-07-04 NOTE — Discharge Instructions (Addendum)
Steps to find a Primary Care Provider (PCP):  Call [phone] or [phone] to access "[facility] Find a Doctor Service."  2.  You may also go on the [facility] website at [email or web address]   Your urine today showed no sign of infection.  We do not see any trichomonas in your urine today.  Your gonorrhea and Chlamydia tests are pending as well as your HIV and syphilis test.  You will be contacted if any of these are positive.  You have received treatments for trichomonas, gonorrhea and chlamydia today in the ED.  Please take your doxycycline twice a day for the next week until complete.  This will complete treatment for chlamydia.  I recommend that you avoid any sexual intercourse for the next week and that all recent sexual partners to be tested and potentially treated as well given your concern for a recent STD exposure.

## 2020-07-04 NOTE — ED Provider Notes (Signed)
[facility] Emergency Department Provider Note  ____________________________________________   Event Date/Time   First MD Initiated Contact with Patient 07/04/20 0216     (approximate)  I have reviewed the triage vital signs and the nursing notes.   HISTORY  Chief Complaint SEXUALLY TRANSMITTED DISEASE    HPI Sergio Lindsey is a 30 y.o. male with history of seizures on Depakote who presents to the emergency department with concerns for STD exposure.  States he was contacted by a previous sexual partner who he last had intercourse with in March that she tested positive for trichomonas.  He states that he is not having any symptoms including fevers, abdominal pain, dysuria, hematuria, discharge, testicular pain or swelling.  He states he is wanting testing, treatment today.        Past Medical History:  Diagnosis Date  . Seizures (HCC)     There are no problems to display for this patient.   History reviewed. No pertinent surgical history.  Prior to Admission medications   Medication Sig Start Date End Date Taking? Authorizing Provider  doxycycline (VIBRAMYCIN) 100 MG capsule Take 1 capsule (100 mg total) by mouth 2 (two) times daily. 07/04/20  Yes Esmeralda Blanford, Baxter Hire N, DO  ondansetron (ZOFRAN ODT) 4 MG disintegrating tablet Take 1 tablet (4 mg total) by mouth every 6 (six) hours as needed for nausea or vomiting. 07/04/20  Yes Bitha Fauteux, Baxter Hire N, DO  divalproex (DEPAKOTE ER) 500 MG 24 hr tablet Take 750 mg by mouth daily.    [provider]  metFORMIN (GLUCOPHAGE) 500 MG tablet Take 1 tablet (500 mg total) by mouth daily with breakfast for 14 days. 05/07/20 05/21/20  Concha Se, MD  predniSONE (DELTASONE) 10 MG tablet Take 6 tablets  today, on day 2 take 5 tablets, day 3 take 4 tablets, day 4 take 3 tablets, day 5 take  2 tablets and 1 tablet the last day 11/15/15   Tommi Rumps, PA-C    Allergies Patient has no known allergies.  No family  history on file.  Social History Social History   Tobacco Use  . Smoking status: Current Every Day Smoker  . Smokeless tobacco: Never Used  Substance Use Topics  . Alcohol use: No  . Drug use: Yes    Types: Marijuana    Review of Systems Constitutional: No fever. Eyes: No visual changes. ENT: No sore throat. Cardiovascular: Denies chest pain. Respiratory: Denies shortness of breath. Gastrointestinal: No nausea, vomiting, diarrhea. Genitourinary: Negative for dysuria. Musculoskeletal: Negative for back pain. Skin: Negative for rash. Neurological: Negative for focal weakness or numbness.  ____________________________________________   PHYSICAL EXAM:  VITAL SIGNS: ED Triage Vitals  Enc Vitals Group     BP 07/04/20 0211 (!) 125/93     Pulse Rate 07/04/20 0211 92     Resp 07/04/20 0211 20     Temp 07/04/20 0211 98.5 F (36.9 C)     Temp Source 07/04/20 0211 Oral     SpO2 07/04/20 0211 97 %     Weight 07/04/20 0212 170 lb (77.1 kg)     Height 07/04/20 0212 5\' 7"  (1.702 m)     Head Circumference --      Peak Flow --      Pain Score 07/04/20 0212 0     Pain Loc --      Pain Edu? --      Excl. in GC? --    CONSTITUTIONAL: Alert and oriented and responds  appropriately to questions. Well-appearing; well-nourished HEAD: Normocephalic EYES: Conjunctivae clear, pupils appear equal, EOM appear intact ENT: normal nose; moist mucous membranes NECK: Supple, normal ROM CARD: RRR; S1 and S2 appreciated; no murmurs, no clicks, no rubs, no gallops RESP: Normal chest excursion without splinting or tachypnea; breath sounds clear and equal bilaterally; no wheezes, no rhonchi, no rales, no hypoxia or respiratory distress, speaking full sentences ABD/GI: Normal bowel sounds; non-distended; soft, non-tender, no rebound, no guarding, no peritoneal signs, no hepatosplenomegaly BACK: The back appears normal EXT: Normal ROM in all joints; no deformity noted, no edema; no cyanosis SKIN:  Normal color for age and race; warm; no rash on exposed skin NEURO: Moves all extremities equally PSYCH: The patient's mood and manner are appropriate.  ____________________________________________   LABS (all labs ordered are listed, but only abnormal results are displayed)  Labs Reviewed  URINALYSIS, COMPLETE (UACMP) WITH MICROSCOPIC - Abnormal; Notable for the following components:      Result Value   Color, Urine YELLOW (*)    APPearance CLEAR (*)    Hgb urine dipstick SMALL (*)    Ketones, ur 20 (*)    All other components within normal limits  URINE CULTURE  CHLAMYDIA/NGC RT PCR ([facility] ONLY)  RPR  HIV ANTIBODY (ROUTINE TESTING W REFLEX)   ____________________________________________  EKG   ____________________________________________  RADIOLOGY I, Yvetta Drotar, personally viewed and evaluated these images (plain radiographs) as part of my medical decision making, as well as reviewing the written report by the radiologist.  ED MD interpretation:    Official radiology report(s): No results found.  ____________________________________________   PROCEDURES  Procedure(s) performed (including Critical Care):  Procedures   ____________________________________________   INITIAL IMPRESSION / ASSESSMENT AND PLAN / ED COURSE  As part of my medical decision making, I reviewed the following data within the electronic MEDICAL RECORD NUMBER Nursing notes reviewed and incorporated, Labs reviewed , Old chart reviewed and Notes from prior ED visits         Patient here with concerns for exposure to trichomonas.  Currently asymptomatic.  Will check urinalysis but he agrees to empiric treatment today.  Agrees to HIV and syphilis testing today as well.  ED PROGRESS  Urinalysis reassuring today.  STD screening pending.  Discussed with patient that he will be contacted if positive.  He has received 500 mg of IM Rocephin here, 2 g of oral Flagyl.  Plan was to discharge  with doxycycline for the next week for empiric treatment for chlamydia.  However he states he does not have insurance and I worry that he would not be able to afford this medication.  He was given a one-time dose of azithromycin as well in the ED.  Have given outpatient PCP follow-up.  Have encouraged him to abstain from sexual activity for at least the next week.  We discussed that if any of his test results are positive, or any recent sexual partners will need to be tested, treated as well.   At this time, I do not feel there is any life-threatening condition present. I have reviewed, interpreted and discussed all results (EKG, imaging, lab, urine as appropriate) and exam findings with patient/family. I have reviewed nursing notes and appropriate previous records.  I feel the patient is safe to be discharged home without further emergent workup and can continue workup as an outpatient as needed. Discussed usual and customary return precautions. Patient/family verbalize understanding and are comfortable with this plan.  Outpatient follow-up has been provided  as needed. All questions have been answered.  ____________________________________________   FINAL CLINICAL IMPRESSION(S) / ED DIAGNOSES  Final diagnoses:  STD exposure     ED Discharge Orders         Ordered    doxycycline (VIBRAMYCIN) 100 MG capsule  2 times daily        07/04/20 0436    ondansetron (ZOFRAN ODT) 4 MG disintegrating tablet  Every 6 hours PRN        07/04/20 0436          *Please note:  Janeann Merl was evaluated in Emergency Department on 07/04/2020 for the symptoms described in the history of present illness. He was evaluated in the context of the global COVID-19 pandemic, which necessitated consideration that the patient might be at risk for infection with the SARS-CoV-2 virus that causes COVID-19. Institutional protocols and algorithms that pertain to the evaluation of patients at risk for COVID-19 are in a  state of rapid change based on information released by regulatory bodies including the CDC and federal and state organizations. These policies and algorithms were followed during the patient's care in the ED.  Some ED evaluations and interventions may be delayed as a result of limited staffing during and the pandemic.*   Note:  This document was prepared using Dragon voice recognition software and may include unintentional dictation errors.   Yukari Flax, Layla Maw, DO 07/04/20 [phone]

## 2020-07-04 NOTE — ED Triage Notes (Signed)
Pt reports that his babies mom was told she was exposed to a STD and he wants to be checked to see if he has anything. He currently does not have any symptoms

## 2020-07-05 LAB — URINE CULTURE: Culture: <10000 — AB
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
## 2022-07-08 ENCOUNTER — Encounter: Payer: Self-pay | Admitting: Emergency Medicine

## 2022-07-08 ENCOUNTER — Emergency Department
Admission: EM | Admit: 2022-07-08 | Discharge: 2022-07-08 | Disposition: A | Discharge status: Home / Self Care | Payer: Self-pay | Attending: Emergency Medicine | Admitting: Emergency Medicine

## 2022-07-08 DIAGNOSIS — K0889 Other specified disorders of teeth and supporting structures: Secondary | ICD-10-CM | POA: Insufficient documentation

## 2022-07-08 DIAGNOSIS — Z5321 Procedure and treatment not carried out due to patient leaving prior to being seen by health care provider: Secondary | ICD-10-CM | POA: Insufficient documentation

## 2022-07-08 NOTE — ED Notes (Signed)
Called multiple times, pt not visualized in the waiting room.

## 2022-07-08 NOTE — ED Triage Notes (Addendum)
Pt presents ambulatory to triage via [facility] with complaints of dental pain that started 3 weeks ago. Pt was seen at the Dentist and was told the tooth needed to be pulled on 5/28. Rates the pain 10/10 has taken motrin at home with no relief. Per EMS, pt has requested Vicodin for this pain.A&Ox4 at this time. Denies fevers, chills, CP or SOB.
